# Patient Record
Sex: Female | Born: 1965 | Hispanic: Yes | Marital: Single | Smoking: Never smoker
Health system: Southern US, Community
[De-identification: ages and names within clinical notes are randomized; demographics above are authoritative.]

## PROBLEM LIST (undated history)

## (undated) HISTORY — PX: APPENDECTOMY: SHX54

---

## 2004-03-05 ENCOUNTER — Encounter: Admission: RE | Admit: 2004-03-05 | Discharge: 2004-03-05 | Payer: Self-pay | Admitting: Obstetrics and Gynecology

## 2005-11-01 ENCOUNTER — Inpatient Hospital Stay (HOSPITAL_COMMUNITY): Admission: AD | Admit: 2005-11-01 | Discharge: 2005-11-01 | Payer: Self-pay | Admitting: Obstetrics & Gynecology

## 2005-11-03 ENCOUNTER — Inpatient Hospital Stay (HOSPITAL_COMMUNITY): Admission: AD | Admit: 2005-11-03 | Discharge: 2005-11-03 | Payer: Self-pay | Admitting: Obstetrics and Gynecology

## 2005-12-01 ENCOUNTER — Ambulatory Visit (HOSPITAL_COMMUNITY): Admission: RE | Admit: 2005-12-01 | Discharge: 2005-12-01 | Payer: Self-pay | Admitting: Obstetrics and Gynecology

## 2005-12-01 ENCOUNTER — Ambulatory Visit: Payer: Self-pay | Admitting: Obstetrics & Gynecology

## 2005-12-17 ENCOUNTER — Ambulatory Visit (HOSPITAL_COMMUNITY): Admission: RE | Admit: 2005-12-17 | Discharge: 2005-12-17 | Payer: Self-pay | Admitting: *Deleted

## 2006-01-05 ENCOUNTER — Ambulatory Visit: Payer: Self-pay | Admitting: Obstetrics & Gynecology

## 2006-04-06 ENCOUNTER — Inpatient Hospital Stay (HOSPITAL_COMMUNITY): Admission: AD | Admit: 2006-04-06 | Discharge: 2006-04-09 | Payer: Self-pay | Admitting: Obstetrics & Gynecology

## 2006-04-06 ENCOUNTER — Ambulatory Visit: Payer: Self-pay | Admitting: Family Medicine

## 2007-11-29 ENCOUNTER — Inpatient Hospital Stay (HOSPITAL_COMMUNITY): Admission: AD | Admit: 2007-11-29 | Discharge: 2007-11-30 | Payer: Self-pay | Admitting: Obstetrics & Gynecology

## 2008-01-17 ENCOUNTER — Ambulatory Visit (HOSPITAL_COMMUNITY): Admission: RE | Admit: 2008-01-17 | Discharge: 2008-01-17 | Payer: Self-pay | Admitting: Obstetrics & Gynecology

## 2008-03-01 ENCOUNTER — Emergency Department (HOSPITAL_COMMUNITY): Admission: EM | Admit: 2008-03-01 | Discharge: 2008-03-02 | Payer: Self-pay | Admitting: Emergency Medicine

## 2008-06-11 ENCOUNTER — Inpatient Hospital Stay (HOSPITAL_COMMUNITY): Admission: AD | Admit: 2008-06-11 | Discharge: 2008-06-13 | Payer: Self-pay | Admitting: Gynecology

## 2008-06-11 ENCOUNTER — Ambulatory Visit: Payer: Self-pay | Admitting: Family Medicine

## 2008-12-11 ENCOUNTER — Encounter (INDEPENDENT_AMBULATORY_CARE_PROVIDER_SITE_OTHER): Payer: Self-pay | Admitting: Surgery

## 2008-12-11 ENCOUNTER — Inpatient Hospital Stay (HOSPITAL_COMMUNITY): Admission: EM | Admit: 2008-12-11 | Discharge: 2008-12-12 | Payer: Self-pay | Admitting: Emergency Medicine

## 2010-10-18 ENCOUNTER — Encounter: Payer: Self-pay | Admitting: *Deleted

## 2011-01-07 LAB — URINE MICROSCOPIC-ADD ON

## 2011-01-07 LAB — BASIC METABOLIC PANEL
BUN: 12 mg/dL (ref 6–23)
Calcium: 8.9 mg/dL (ref 8.4–10.5)
Chloride: 106 mEq/L (ref 96–112)
Creatinine, Ser: 0.71 mg/dL (ref 0.4–1.2)
GFR calc Af Amer: >60 mL/min (ref >60)
GFR calc non Af Amer: >60 mL/min (ref >60)
Potassium: 4 mEq/L (ref 3.5–5.1)

## 2011-01-07 LAB — DIFFERENTIAL
Basophils Relative: 0 % (ref 0–1)
Eosinophils Relative: 0 % (ref 0–5)
Lymphocytes Relative: 8 % — ABNORMAL LOW (ref 12–46)
Lymphs Abs: 1.4 10*3/uL (ref 0.7–4.0)
Monocytes Absolute: 1 10*3/uL (ref 0.1–1.0)
Monocytes Relative: 6 % (ref 3–12)
Neutro Abs: 14.5 10*3/uL — ABNORMAL HIGH (ref 1.7–7.7)
Neutrophils Relative %: 85 % — ABNORMAL HIGH (ref 43–77)

## 2011-01-07 LAB — URINALYSIS, ROUTINE W REFLEX MICROSCOPIC: Urobilinogen, UA: 0.2 mg/dL (ref 0.0–1.0)

## 2011-01-07 LAB — CBC: RDW: 13.7 % (ref 11.5–15.5)

## 2011-02-09 NOTE — H&P (Signed)
NAMECAM, HARNDEN      ACCOUNT NO.:  [id number]   MEDICAL RECORD NO.:  [id number]          PATIENT TYPE:  INP   LOCATION:  5151                         FACILITY:  [facility]   PHYSICIAN:  Sandria Bales. Ezzard Standing, M.D.  DATE OF BIRTH:  15-Apr-1966   DATE OF ADMISSION:  12/10/2008  DATE OF DISCHARGE:                              HISTORY & PHYSICAL   Date of Admission - 11 December 2008   HISTORY OF ILLNESS:  This is a 45 year old Hispanic female who has no  identified primary medical doctor who developed right-sided abdominal  pain.  This morning, this pain worsened and localized to the right lower  quadrant.  She was brought in by her husband about 6 p.m. tonight.  CT  scan evaluation revealed probable appendicitis.   The patient had an episode of right-sided pain when she was pregnancy  last year, but this resolved with no clear diagnosis.  She has had no  further symptoms since then.   She denies any history of peptic ulcer disease, liver disease,  pancreatic disease, colon disease, or any prior abdominal surgery.  Both  her babies have been born vaginally.   PAST MEDICAL HISTORY:  She has no allergies.   She is on no medications, though she took some ibuprofen today.   REVIEW OF SYSTEMS:  NEUROLOGIC:  No seizure or loss of consciousness.  PULMONARY:  Does not smoke cigarettes.  No history of pneumonia or  tuberculosis.  CARDIAC:  No history of heart disease, chest pain, or hypertension.  GASTROINTESTINAL:  See history of present illness.  GYN:  She has had two children, one child about 8 years old and then her  second child was born on June 11, 2008, she is about 7 months old,  she is still breastfeeding.  UROLOGIC:  No kidney stones or kidney infections.   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  She is accompanied  by her husband.  She understands English pretty well and speaks it very  well.  Her husband understands and speaks [place] well.   PHYSICAL  EXAMINATION:  VITAL SIGNS:  Her temperature is 100.1, her pulse  is 95, her blood pressure is 95/62, pulse 76, respirations 20.  GENERAL:  She is a well-nourished Hispanic female, alert and cooperative  on physical exam.  HEENT:  Unremarkable.  NECK:  Supple without mass, without thyromegaly.  LUNGS:  Clear to auscultation.  HEART:  Regular rate and rhythm without murmur or rub.  ABDOMEN:  She is tender in the right abdomen, a little bit right upper  quadrant, but well localized in right lower quadrant with some guarding.  She has no rebound.  She has bowel sounds, which are decreased but  present.  EXTREMITIES:  She has good strength on four extremities and  neurologically is grossly intact.   Labs that I have show a white blood count of 17,000, hemoglobin of 14,  hematocrit 41, platelet count 323,000.  Her sodium was 137, potassium  4.0, chloride of 106, CO2 of 25, glucose of 110.  Her urinalysis was  negative.  Her pregnancy test is negative.  Her  CT scan shows changes consistent with probable appendicitis.   IMPRESSION:  1. Probable appendicitis.  Discussed with the patient and her husband      about proceeding to the operating room tonight, discussed about      laparoscopic and open surgery.  Risks of surgery include, but are      not limited to, bleeding, infection, which I think she already has,      a possibility of open surgery and a possibility of the diagnosis is      not appendicitis.   1. Recent pregnancy      Sandria Bales. Ezzard Standing, M.D.  Electronically Signed     DHN/MEDQ  D:  12/11/2008  T:  12/11/2008  Job:  [id number]

## 2011-02-09 NOTE — Op Note (Signed)
Sheena Walton, Sheena Walton      ACCOUNT NO.:  [id number]   MEDICAL RECORD NO.:  [id number]          PATIENT TYPE:  INP   LOCATION:  5151                         FACILITY:  [facility]   PHYSICIAN:  Sandria Bales. Ezzard Standing, M.D.  DATE OF BIRTH:  Apr 01, 1966   DATE OF PROCEDURE:  12/11/2008  DATE OF DISCHARGE:                               OPERATIVE REPORT   Date of Surgery - 11 December 2008   PREOPERATIVE DIAGNOSIS:  Acute appendicitis.   POSTOPERATIVE DIAGNOSIS:  Acute purulent appendicitis.   PROCEDURES:  Laparoscopic appendectomy.   SURGEON:  Sandria Bales. Ezzard Standing, MD   FIRST ASSISTANT:  None.   ANESTHESIA:  General endotracheal.   ESTIMATED BLOOD LOSS:  Minimal.   INDICATIONS FOR PROCEDURE:  Sheena Walton is a 45 year old Hispanic  female who has acute appendicitis.  She has no primary MD.  Her husband  was at her bedside during the interview and discussion.  He understand  [place] well.   I have discussed with her the indications and potential complications of  appendectomy.  Potential complications include, but are not limited to,  bleeding, infection, bowel injury, and the possibility of open surgery.   OPERATIVE NOTE:  The patient placed in a supine position with her left  arm tucked aside.  She had Unasyn as antibiotic.  Foley catheter was in  place.  Abdomen prepped with Betadine solution and sterilely draped, and  the time-out was held identifying the patient and the procedure.   I accessed her abdominal cavity through a Hasson trocar in a  infraumbilical incision and this was sewn in place with 0 Vicryl suture.  I placed a 5-mm trocar in the right upper quadrant, 11-mm trocar in the  left lower quadrant during abdominal exploration.  Right and left lobes  of liver were unremarkable.  The stomach was mildly dilated, but  otherwise unremarkable.  Her bowel that I could see was unremarkable.  She did have some band-like adhesions in her pelvis down to a uterine  fibroid which was  probably about 2 cm in diameter.  I divided these  adhesions with a harmonic scalpel and took photos and put these in the  chart.  I then turned attention to the appendix had purulence on it  consistent with acute purulent appendicitis.  Part of the small bowel,  terminal ileum was draped over this and took this down with a harmonic  scalpel, then took down the mesentery of the appendix with a harmonic  scalpel.  I divided the base of the appendix with a 45-mm Endo-GIA  stapler, a vascular load, and delivered the appendix through the  umbilicus to the  Endocatch bag.   I then reinspected the staple line of the cecum.  I irrigated this with  about 300 mL of saline.  I reinspected the adhesions that I took down to  the pelvis and saw no bleeding from these.  The trocars were then  removed in turn.  The umbilical port was closed with 0 Vicryl suture.  Each port was removed and no bleeding at port site.   The skin was closed using a 5-0 Vicryl suture, painted with tincture  of  Benzoin and Steri-stripped.  The patient was transferred to recovery  room in good condition.  Sponge and needle count were correct at the end  of the case.      Sandria Bales. Ezzard Standing, M.D.  Electronically Signed     DHN/MEDQ  D:  12/11/2008  T:  12/11/2008  Job:  [id number]

## 2011-02-12 NOTE — Group Therapy Note (Signed)
Sheena Walton, Sheena Walton                         ACCOUNT NO.:  [id number]   MEDICAL RECORD NO.:  [id number]                   PATIENT TYPE:  OUT   LOCATION:  [facility]                           FACILITY:  [facility]   PHYSICIAN:  Tinnie Gens, MD                     DATE OF BIRTH:  1966-05-28   DATE OF SERVICE:  03/05/2004                                    CLINIC NOTE   CHIEF COMPLAINT:  Pap smear.   HISTORY OF PRESENT ILLNESS:  The patient is a 45 year old gravida 0 who  comes in today for her first GYN exam.  She has no significant complaints  today.  She reports that she has been trying to get pregnant for the last 6  months but she is still using condoms occasionally.  She has been with her  current partner for 6 months.  She also is requesting HIV testing today.   PAST MEDICAL HISTORY:  Negative.   PAST SURGICAL HISTORY:  Negative.   MEDICATIONS:  None.   ALLERGIES:  None known.   GYNECOLOGICAL HISTORY:  Menarche at age 62, monthly cycles.  LMP was May  22nd.  No history of STD's.  No history of pelvic infections.  She has no  history of a previous Pap smear.   FAMILY HISTORY:  Significant for hypertension.   SOCIAL HISTORY:  No tobacco, alcohol, or drug use.   A 14-point review of systems reviewed and is negative.   PHYSICAL EXAMINATION:  VITAL SIGNS:  Pulse 75, blood pressure 125/78, weight  139.  GENERAL:  She is well-developed, well-nourished Hispanic female in no acute  distress.  ABDOMEN:  Soft, nontender, nondistended.  GENITOURINARY:  Normal external female genitalia.  The vagina is rugated.  The cervix is nulliparous and clean.  The uterus is long and anteverted.  There is no adnexal mass or tenderness.   IMPRESSION:  1. Gynecologic exam.  2. Human immunodeficiency virus screening.   PLAN:  1. Pap smear today, HIV testing.  2. The patient will follow up in 6 months if she does not get pregnant.     Some counseling was done with the patient     regarding  appropriate timing of intercourse and to stop using condoms.     If in 6 months she has not had a pregnancy, we will begin infertility     workup.                                               Tinnie Gens, MD    TP/MEDQ  D:  03/05/2004  T:  03/05/2004  Job:  [id number]

## 2011-06-21 LAB — ABO/RH: ABO/RH(D): A POS

## 2011-06-21 LAB — CBC: RDW: 14.7

## 2011-06-21 LAB — WET PREP, GENITAL
Trich, Wet Prep: NONE SEEN
Yeast Wet Prep HPF POC: NONE SEEN

## 2011-06-21 LAB — GC/CHLAMYDIA PROBE AMP, GENITAL: GC Probe Amp, Genital: NEGATIVE

## 2011-06-24 LAB — COMPREHENSIVE METABOLIC PANEL
AST: 31
Albumin: 3.2 — ABNORMAL LOW
BUN: 7
Chloride: 102
Creatinine, Ser: 0.56
GFR calc non Af Amer: >60
Glucose, Bld: 88
Total Bilirubin: 0.6
Total Protein: 6.6

## 2011-06-24 LAB — CBC
HCT: 38.3
Hemoglobin: 13.2
MCHC: 34.5
Platelets: 325

## 2011-06-24 LAB — URINALYSIS, ROUTINE W REFLEX MICROSCOPIC
Ketones, ur: >80 — AB
Protein, ur: 30 — AB
Specific Gravity, Urine: 1.029
Urobilinogen, UA: 0.2
pH: 6.5

## 2011-06-24 LAB — DIFFERENTIAL
Basophils Relative: 1
Eosinophils Absolute: 0
Monocytes Absolute: 0.4
Monocytes Relative: 3
Neutrophils Relative %: 89 — ABNORMAL HIGH

## 2011-06-24 LAB — URINE MICROSCOPIC-ADD ON

## 2011-06-24 LAB — LIPASE, BLOOD: Lipase: 24

## 2011-06-28 LAB — CBC
Hemoglobin: 12.4
MCHC: 33.3
MCV: 87.8
RDW: 14.7

## 2019-12-10 ENCOUNTER — Encounter (HOSPITAL_COMMUNITY): Payer: Self-pay

## 2019-12-10 ENCOUNTER — Ambulatory Visit (HOSPITAL_COMMUNITY)
Admission: EM | Admit: 2019-12-10 | Discharge: 2019-12-10 | Disposition: A | Discharge status: Home / Self Care | Payer: HRSA Program | Attending: Family Medicine | Admitting: Family Medicine

## 2019-12-10 DIAGNOSIS — Z20822 Contact with and (suspected) exposure to covid-19: Secondary | ICD-10-CM | POA: Insufficient documentation

## 2019-12-10 DIAGNOSIS — R112 Nausea with vomiting, unspecified: Secondary | ICD-10-CM | POA: Diagnosis present

## 2019-12-10 DIAGNOSIS — R630 Anorexia: Secondary | ICD-10-CM | POA: Insufficient documentation

## 2019-12-10 DIAGNOSIS — G43919 Migraine, unspecified, intractable, without status migrainosus: Secondary | ICD-10-CM | POA: Insufficient documentation

## 2019-12-10 DIAGNOSIS — R5383 Other fatigue: Secondary | ICD-10-CM | POA: Diagnosis present

## 2019-12-10 MED ORDER — ONDANSETRON HCL 4 MG PO TABS: 4.0000 mg | ORAL_TABLET | Freq: Four times a day (QID) | ORAL | 0 refills | Status: AC

## 2019-12-10 MED ORDER — ONDANSETRON 4 MG PO TBDP
4.0000 mg | ORAL_TABLET | Freq: Once | ORAL | Status: AC
  Administered 2019-12-10: 4 mg via ORAL

## 2019-12-10 MED ORDER — KETOROLAC TROMETHAMINE 60 MG/2ML IM SOLN
60.0000 mg | Freq: Once | INTRAMUSCULAR | Status: AC
  Administered 2019-12-10: 60 mg via INTRAMUSCULAR

## 2019-12-10 MED ORDER — ONDANSETRON 4 MG PO TBDP
ORAL_TABLET | ORAL | Status: AC
  Filled 2019-12-10: qty 1

## 2019-12-10 MED ORDER — MECLIZINE HCL 25 MG PO TABS: 25.0000 mg | ORAL_TABLET | Freq: Three times a day (TID) | ORAL | 0 refills | Status: AC | PRN

## 2019-12-10 MED ORDER — KETOROLAC TROMETHAMINE 60 MG/2ML IM SOLN
INTRAMUSCULAR | Status: AC
  Filled 2019-12-10: qty 2

## 2019-12-10 NOTE — ED Provider Notes (Signed)
[facility]    CSN: [id number] Arrival date & time: 12/10/19  1109      History   Chief Complaint Chief Complaint  Patient presents with  . Emesis  . Headache    HPI Sheena Walton is a 54 y.o. female.   Patient reports headache behind her eyes, nausea, vomiting, nasal congestion, pain with moving her eyes, chills and dizziness for the last 5 days.  No significant medical history.  Denies sick contacts.  Denies cough shortness of breath, diarrhea, fever, rash, other symptoms.  ROS per HPI  The history is provided by the patient and the spouse.    History reviewed. No pertinent past medical history.  There are no problems to display for this patient.   Past Surgical History:  Procedure Laterality Date  . APPENDECTOMY      OB History   No obstetric history on file.      Home Medications    Prior to Admission medications   Medication Sig Start Date End Date Taking? Authorizing Provider  meclizine (ANTIVERT) 25 MG tablet Take 1 tablet (25 mg total) by mouth 3 (three) times daily as needed for dizziness. 12/10/19   Moshe Cipro, NP  ondansetron (ZOFRAN) 4 MG tablet Take 1 tablet (4 mg total) by mouth every 6 (six) hours. 12/10/19   Moshe Cipro, NP    Family History Family History  Problem Relation Age of Onset  . Healthy Mother   . Healthy Father     Social History Social History   Tobacco Use  . Smoking status: Never Smoker  . Smokeless tobacco: Never Used  Substance Use Topics  . Alcohol use: Not Currently  . Drug use: Never     Allergies   Patient has no known allergies.   Review of Systems Review of Systems   Physical Exam Triage Vital Signs ED Triage Vitals  Enc Vitals Group     BP 12/10/19 1128 130/73     Pulse Rate 12/10/19 1128 71     Resp 12/10/19 1128 17     Temp 12/10/19 1128 98.5 F (36.9 C)     Temp Source 12/10/19 1128 Oral     SpO2 12/10/19 1128 100 %     Weight 12/10/19 1125 165 lb (74.8 kg)      Height 12/10/19 1125 5\' 5"  (1.651 m)     Head Circumference --      Peak Flow --      Pain Score 12/10/19 1125 0     Pain Loc --      Pain Edu? --      Excl. in GC? --    No data found.  Updated Vital Signs BP 130/73 (BP Location: Left Arm)   Pulse 71   Temp 98.5 F (36.9 C) (Oral)   Resp 17   Ht 5\' 5"  (1.651 m)   Wt 165 lb (74.8 kg)   LMP 12/03/2019 (Approximate)   SpO2 100%   BMI 27.46 kg/m   Visual Acuity Right Eye Distance:   Left Eye Distance:   Bilateral Distance:    Right Eye Near:   Left Eye Near:    Bilateral Near:     Physical Exam Vitals and nursing note reviewed.  Constitutional:      General: She is not in acute distress.    Appearance: Normal appearance. She is well-developed. She is ill-appearing.  HENT:     Head: Normocephalic and atraumatic.     Right Ear: Tympanic membrane normal.  Left Ear: Tympanic membrane normal.     Nose: Congestion present.     Mouth/Throat:     Mouth: Mucous membranes are moist.     Pharynx: No posterior oropharyngeal erythema.  Eyes:     General: No scleral icterus.       Right eye: No discharge.        Left eye: No discharge.     Extraocular Movements: Extraocular movements intact.     Conjunctiva/sclera: Conjunctivae normal.     Pupils: Pupils are equal, round, and reactive to light.  Cardiovascular:     Rate and Rhythm: Normal rate and regular rhythm.     Heart sounds: Normal heart sounds. No murmur.  Pulmonary:     Effort: Pulmonary effort is normal. No respiratory distress.     Breath sounds: Normal breath sounds. No stridor. No wheezing, rhonchi or rales.  Chest:     Chest wall: No tenderness.  Abdominal:     General: Bowel sounds are normal. There is no distension.     Palpations: Abdomen is soft. There is no mass.     Tenderness: There is no abdominal tenderness. There is no guarding or rebound.     Hernia: No hernia is present.  Musculoskeletal:        General: Normal range of motion.      Cervical back: Normal range of motion and neck supple.  Skin:    General: Skin is warm and dry.     Capillary Refill: Capillary refill takes less than 2 seconds.  Neurological:     General: No focal deficit present.     Mental Status: She is alert and oriented to person, place, and time. Mental status is at baseline.     Cranial Nerves: No cranial nerve deficit.     Sensory: No sensory deficit.     Motor: No weakness.     Coordination: Coordination normal.     Gait: Gait normal.     Deep Tendon Reflexes: Reflexes normal.  Psychiatric:        Mood and Affect: Mood normal.        Behavior: Behavior normal.      UC Treatments / Results  Labs (all labs ordered are listed, but only abnormal results are displayed) Labs Reviewed  SARS CORONAVIRUS 2 (TAT 6-24 HRS)    EKG   Radiology No results found.  Procedures Procedures (including critical care time)  Medications Ordered in UC Medications  ondansetron (ZOFRAN-ODT) disintegrating tablet 4 mg (4 mg Oral Given 12/10/19 1149)  ketorolac (TORADOL) injection 60 mg (60 mg Intramuscular Given 12/10/19 1157)    Initial Impression / Assessment and Plan / UC Course  I have reviewed the triage vital signs and the nursing notes.  Pertinent labs & imaging results that were available during my care of the patient were reviewed by me and considered in my medical decision making (see chart for details).     Headache, eye pain with eye movement, dizziness, decreased appetite, nausea, vomiting x5 days.  Zofran 4 mg sublingual given in office today.  Zofran 4 mg sublingual sent to patient's pharmacy.  Meclizine 25 mg sent to patient's pharmacy for dizziness.  Received 60 mg Toradol IM in office today to try to help relieve headache.  If there is no relief of the headache or eye pain, patient is to report to the ER.  Patient and husband agreeable to treatment plan. Final Clinical Impressions(s) / UC Diagnoses   Final diagnoses:  Nausea  and  vomiting, intractability of vomiting not specified, unspecified vomiting type  Intractable migraine without status migrainosus, unspecified migraine type  Other fatigue  Decreased appetite     Discharge Instructions     You may have ocular migraine, and this is why your eyes are hurting.  I have given you Toradol injection in the office today.  Have also given you Zofran for nausea in the office today.  I have sent in Zofran for nausea to your pharmacy.  I have also sent in meclizine for your dizziness.  Your COVID test is pending.  You should self quarantine until your test result is back and is negative.    Take Tylenol as needed for fever or discomfort.  Rest and keep yourself hydrated.    Go to the emergency department if you develop high fever, shortness of breath, severe diarrhea, or other concerning symptoms.        ED Prescriptions    Medication Sig Dispense Auth. Provider   ondansetron (ZOFRAN) 4 MG tablet Take 1 tablet (4 mg total) by mouth every 6 (six) hours. 12 tablet Moshe Cipro, NP   meclizine (ANTIVERT) 25 MG tablet Take 1 tablet (25 mg total) by mouth 3 (three) times daily as needed for dizziness. 30 tablet Moshe Cipro, NP     I have reviewed the PDMP during this encounter.   Moshe Cipro, NP 12/10/19 1230

## 2019-12-10 NOTE — ED Triage Notes (Signed)
Pt presents with c/o emesis, headache, nasal congestion, eye pain, chills and dizziness for several days. Pt denies cough, shob, diarrhea or other symptoms. No one else in the home is sick. Pt denies any known sick contacts.

## 2019-12-10 NOTE — ED Notes (Signed)
Spoke to patient .  Patient has headache, vomiting for 3 days.  Spoke to Dahlia Byes, NP about patient, to be seen at [facility]

## 2019-12-10 NOTE — Discharge Instructions (Addendum)
You may have ocular migraine, and this is why your eyes are hurting.  I have given you Toradol injection in the office today.  Have also given you Zofran for nausea in the office today.  I have sent in Zofran for nausea to your pharmacy.  I have also sent in meclizine for your dizziness.  Your COVID test is pending.  You should self quarantine until your test result is back and is negative.    Take Tylenol as needed for fever or discomfort.  Rest and keep yourself hydrated.    Go to the emergency department if you develop high fever, shortness of breath, severe diarrhea, or other concerning symptoms.

## 2019-12-11 LAB — SARS CORONAVIRUS 2 (TAT 6-24 HRS): SARS Coronavirus 2: NEGATIVE

## 2020-01-09 ENCOUNTER — Other Ambulatory Visit: Payer: Self-pay | Admitting: Nurse Practitioner

## 2020-01-09 ENCOUNTER — Other Ambulatory Visit: Payer: Self-pay

## 2020-01-09 DIAGNOSIS — Z1231 Encounter for screening mammogram for malignant neoplasm of breast: Secondary | ICD-10-CM

## 2020-01-24 ENCOUNTER — Ambulatory Visit
Admission: RE | Admit: 2020-01-24 | Discharge: 2020-01-24 | Disposition: A | Discharge status: Home / Self Care | Payer: No Typology Code available for payment source | Source: Ambulatory Visit | Attending: Nurse Practitioner | Admitting: Nurse Practitioner

## 2020-01-24 ENCOUNTER — Other Ambulatory Visit: Payer: Self-pay

## 2020-01-24 DIAGNOSIS — Z1231 Encounter for screening mammogram for malignant neoplasm of breast: Secondary | ICD-10-CM

## 2020-12-10 ENCOUNTER — Other Ambulatory Visit: Payer: Self-pay | Admitting: Obstetrics and Gynecology

## 2020-12-10 DIAGNOSIS — Z1231 Encounter for screening mammogram for malignant neoplasm of breast: Secondary | ICD-10-CM

## 2021-01-22 ENCOUNTER — Ambulatory Visit: Payer: No Typology Code available for payment source | Admitting: Obstetrics and Gynecology

## 2021-01-27 ENCOUNTER — Encounter (INDEPENDENT_AMBULATORY_CARE_PROVIDER_SITE_OTHER): Payer: Self-pay

## 2021-01-27 ENCOUNTER — Other Ambulatory Visit: Payer: Self-pay

## 2021-01-27 ENCOUNTER — Ambulatory Visit
Admission: RE | Admit: 2021-01-27 | Discharge: 2021-01-27 | Disposition: A | Discharge status: Home / Self Care | Payer: No Typology Code available for payment source | Source: Ambulatory Visit | Attending: Obstetrics and Gynecology | Admitting: Obstetrics and Gynecology

## 2021-01-27 ENCOUNTER — Ambulatory Visit: Payer: No Typology Code available for payment source | Admitting: *Deleted

## 2021-01-27 VITALS — BP 132/84 | Wt 171.0 lb

## 2021-01-27 DIAGNOSIS — Z1239 Encounter for other screening for malignant neoplasm of breast: Secondary | ICD-10-CM

## 2021-01-27 DIAGNOSIS — R87610 Atypical squamous cells of undetermined significance on cytologic smear of cervix (ASC-US): Secondary | ICD-10-CM

## 2021-01-27 DIAGNOSIS — Z1231 Encounter for screening mammogram for malignant neoplasm of breast: Secondary | ICD-10-CM

## 2021-01-27 DIAGNOSIS — R87810 Cervical high risk human papillomavirus (HPV) DNA test positive: Secondary | ICD-10-CM

## 2021-01-27 NOTE — Progress Notes (Signed)
Sheena Walton is a 55 y.o. female who presents to [facility] clinic today with no complaints. Patient referred to [facility] by the [facility] due to having an abnormal Pap smear 12/09/2020 that a colposcopy is recommended for follow-up.    Pap Smear: Pap smear not completed today. Last Pap smear was 12/09/2020 at the [facility] clinic and was abnormal - ASCUS with positive HPV. Per patient has no history of an abnormal Pap smear prior to her most recent Pap smear. Last Pap smear result is available in Epic.   Physical exam: Breasts Breasts symmetrical. No skin abnormalities bilateral breasts. No nipple retraction bilateral breasts. No nipple discharge bilateral breasts. No lymphadenopathy. No lumps palpated bilateral breasts. No complaints of pain or tenderness on exam.      Pelvic/Bimanual Pap is not indicated today per BCCCP guidelines.    Smoking History: Patient has never smoked.   Patient Navigation: Patient education provided. Access to services provided for patient through [facility] [place] program. Spanish interpreter Sheena Walton from [facility] provided.   Colorectal Cancer Screening: Per patient has never had colonoscopy completed. No complaints today.    Breast and Cervical Cancer Risk Assessment: Patient has family history of her sister having breast cancer. Patient has no known genetic mutations or history of radiation treatment to the chest before age 18. Patient does not have history of cervical dysplasia, immunocompromised, or DES exposure in-utero.  Risk Assessment    Risk Scores      01/27/2021   Last edited by: Sheena Walton, CMA   5-year risk: 1.5 %   Lifetime risk: 10.6 %          A: BCCCP exam without pap smear No complaints.  P: Referred patient to the [facility] for a screening mammogram on the mobile unit. Appointment scheduled Tuesday, Jan 27, 2021 at 1100.  Referred patient to the [facility]  for [facility] for her colposcopy to follow-up for her abnormal Pap smear. Appointment scheduled Monday, Feb 02, 2021 at 0815.  Sheena Heidelberg, RN 01/27/2021 10:07 AM

## 2021-01-27 NOTE — Patient Instructions (Signed)
Explained breast self awareness with Sheena Walton. Patient did not need a Pap smear today due to last Pap smear was 12/09/2020. Explained the colposcopy the recommended follow-up for her abnormal Pap smear. Referred patient to the [facility] for [facility] for her colposcopy to follow-up for her abnormal Pap smear. Appointment scheduled Monday, Feb 02, 2021 at 0815. Referred patient to the [facility] of [facility] for a screening mammogram on the mobile unit. Appointment scheduled Tuesday, Jan 27, 2021 at 1100. Patient escorted to the mobile unit following BCCCP appointment for her screening mammogram. Let patient know the Breast Center will follow up with her within the next couple weeks with results of her mammogram by letter or phone. Sheena Walton verbalized understanding.  Sheena Walton, Kathaleen Maser, RN 10:07 AM

## 2021-01-29 ENCOUNTER — Other Ambulatory Visit: Payer: Self-pay | Admitting: Obstetrics and Gynecology

## 2021-01-29 DIAGNOSIS — R928 Other abnormal and inconclusive findings on diagnostic imaging of breast: Secondary | ICD-10-CM

## 2021-02-02 ENCOUNTER — Encounter: Payer: Self-pay | Admitting: Obstetrics and Gynecology

## 2021-02-02 ENCOUNTER — Other Ambulatory Visit (HOSPITAL_COMMUNITY)
Admission: RE | Admit: 2021-02-02 | Discharge: 2021-02-02 | Disposition: A | Discharge status: Home / Self Care | Payer: No Typology Code available for payment source | Source: Ambulatory Visit | Attending: Obstetrics and Gynecology | Admitting: Obstetrics and Gynecology

## 2021-02-02 ENCOUNTER — Ambulatory Visit (INDEPENDENT_AMBULATORY_CARE_PROVIDER_SITE_OTHER): Payer: Self-pay | Admitting: Obstetrics and Gynecology

## 2021-02-02 ENCOUNTER — Other Ambulatory Visit: Payer: Self-pay

## 2021-02-02 DIAGNOSIS — R87810 Cervical high risk human papillomavirus (HPV) DNA test positive: Secondary | ICD-10-CM

## 2021-02-02 DIAGNOSIS — Z3202 Encounter for pregnancy test, result negative: Secondary | ICD-10-CM

## 2021-02-02 DIAGNOSIS — R87610 Atypical squamous cells of undetermined significance on cytologic smear of cervix (ASC-US): Secondary | ICD-10-CM | POA: Insufficient documentation

## 2021-02-02 LAB — POCT PREGNANCY, URINE: Preg Test, Ur: NEGATIVE

## 2021-02-02 NOTE — Progress Notes (Signed)
Patient given informed consent, signed copy in the chart, time out was performed.  Placed in lithotomy position.  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  UPT negative. Cervix viewed with speculum and colposcope after application of acetic acid.   Colposcopy adequate?  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  no Biopsies? Yes at 12 and 2 oclock ECC? no   Paraguard IUD strings noted in cervical os.  COMMENTS:  Patient was given post procedure instructions.   Warden Fillers, MD

## 2021-02-02 NOTE — Patient Instructions (Signed)
Displasia cervical Cervical Dysplasia  La displasia cervical es una afeccin que ocurre cuando las clulas del cuello uterino de una mujer presentan cambios anormales. El cuello uterino es la abertura del tero. Se encuentra entre la vagina y Careers information officer. La displasia cervical puede ser un signo temprano de cncer de cuello uterino. Si no se trata, esta afeccin puede volverse ms grave y progresar a cncer de cuello uterino. La deteccin temprana, el tratamiento y la atencin de seguimiento son Engineer, production. Cules son las causas? La displasia cervical generalmente es causada por una infeccin del virus del papiloma humano (VPH). El VPH se propaga de Neomia Dear persona a otra a travs del contacto sexual. Esto abarca la actividad sexual vaginal, oral y anal. La infeccin causada por el VPH es la infeccin de transmisin sexual (ITS) ms frecuente. Tiene ms probabilidades de estar expuesto al VPH a travs del contacto sexual si:  Ha tenido ms de [place] pareja sexual o tiene una pareja sexual que tiene mltiples parejas sexuales.  No [place] condn [employer], especialmente con parejas sexuales nuevas. Qu incrementa el riesgo? Los siguientes factores pueden hacer que sea ms propensa a Aeronautical engineer afeccin:  Tener antecedentes familiares de cncer de cuello uterino o antecedentes personales de cncer de vagina o vulva.  Haber tenido una ITS, como herpes, clamidia o gonorrea.  Comenzar a llevar una vida sexual activa antes de los [address].  Tener debilitado el sistema que combate las enfermedades (sistemainmunitario).  Fumar.  Ser hija de una mujer que tom dietilestilbestrol (DES), un estrgeno sinttico, Academic librarian. Cules son los signos o sntomas? Esta afeccin generalmente no presenta sntomas. Si tiene sntomas, pueden incluir los siguientes:  Secrecin vaginal anormal.  Sangrado entre los perodos menstruales o despus de Warehouse manager relaciones  sexuales.  Sangrado durante la menopausia.  Dolor al [employer]. Cmo se diagnostica? Esta afeccin se puede diagnosticar con una prueba de Papanicolaou. Durante esta prueba, se extraen clulas del cuello uterino y se observan con un microscopio. Si la prueba de Papanicolaou es anormal o si el aspecto del cuello del tero es anormal, tambin puede realizarse una prueba en la que se extirpa tejido del cuello uterino y se analiza bajo el microscopio(biopsia). Cmo se trata? El tratamiento vara segn la gravedad de la enfermedad. El tratamiento puede incluir:  Crioterapia. Durante esa terapia, las clulas anormales se congelan con un instrumento con punta de acero.  Procedimiento de escisin electroquirrgica con asa (loop electrosurgical excision procedure, LEEP). El LEEP extirpa tejido anormal del cuello uterino.  Ciruga para extirpar tejido anormal. Generalmente, esto se hace en los casos ms graves. Entre las opciones se incluyen las siguientes: ? Biopsia en cono. Este tratamiento elimina el canal cervical y parte del centro del cuello del tero. ? Histerectoma. En esta ciruga se extirpan el tero y el cuello uterino. Siga estas instrucciones en su casa:  Use los medicamentos de venta libre y los recetados solamente como se lo haya indicado el mdico.  No use tampones, duchas vaginales, ni tenga relaciones sexuales hasta que el mdico la autorice.  Cumpla con todas las visitas de seguimiento. Esto es importante. Las mujeres que han recibido tratamiento para la displasia cervical deberan someterse a pruebas de Papanicolaou y exmenes plvicos de forma regular. Cmo se previene?  Practique el sexo seguro para ayudar a prevenir las ITS.  Hgase pruebas de Papanicolaou de forma regular. Hable con el mdico sobre la frecuencia con la que debe hacerse estas pruebas. Las [employer]  de Papanicolaou ayudan a Engineer, manufacturing los [employer] clulas que pueden Therapist, music.  Pregntele al  [employer] las posibles vacunas para protegerse contra el VPH. Comunquese con un mdico si:  Presenta verrugas genitales. El riesgo de cncer de cuello uterino es ms alto con ciertos tipos de VPH.  Observa cogulos de sangre o una hemorragia ms abundante que un periodo menstrual normal o desarrolla una hemorragia de color rojo brillante, que puede incluir cogulos de [place].  Tiene una secrecin vaginal anormal.  Lance Muss. Solicite ayuda de inmediato si:  Tiene dolor o clicos en el abdomen que empeoran y no se alivian con los medicamentos.  Se siente mareada e inusualmente dbil o se desmaya. Resumen  La displasia cervical es una afeccin que ocurre cuando una mujer presenta cambios anormales en las clulas del cuello uterino.  Si no se trata, esta afeccin puede volverse ms grave y progresar a cncer de cuello uterino.  La deteccin temprana, el tratamiento y la atencin de seguimiento son muy importantes para Research officer, trade union.  Hgase exmenes plvicos y pruebas de Papanicolaou de forma regular. Hable con el mdico sobre la frecuencia con la que debe hacerse estas pruebas. Las pruebas de Papanicolaou ayudan a Sports coach en las clulas que pueden Therapist, music. Esta informacin no tiene Theme park manager el consejo del mdico. Asegrese de hacerle al mdico cualquier pregunta que tenga. Document Revised: 04/25/2020 Document Reviewed: 04/25/2020 Elsevier Patient Education  2021 Elsevier Inc. Informacin sobre el VPH y Management consultant HPV and Cancer Information El VPH (virus del papiloma humano)es un virus muy comn que se transmite de Neomia Dear persona a otra a travs del contacto piel con piel o del contacto sexual. Hay varios tipos de VPH. A menudo no causa sntomas. Sin embargo, dependiendo del tipo, a Event organiser (VPH genitalo mucoso) o en las manos o los pies (VPH cutneo o no mucoso). Las personas pueden estar infectadas con el  VPH por un Harle Battiest y transmitirlo a Economist sin saberlo. Algunas infecciones por el VPH desaparecen por s solas en un perodo de 2 aos, pero otras infecciones por el VPH se consideran de alto riesgo y pueden causar cambios en las clulas que pueden Geophysical data processor. Usted puede tomar medidas para evitar la infeccin por el VPH y disminuir el riesgo de Research officer, political party. Cmo puede afectarme el VPH? El VPH puede provocar verrugas en los genitales o en las manos o los pies. Tambin puede causar lesiones similares a verrugas en la garganta.  Ciertos tipos de VPH genital tambin Tourist information centre manager, [employer] se pueden incluir:  Cncer de cuello uterino.  Cncer vaginal.  Cncer vulvar.  Cncer anal.  Cncer de garganta.  Cncer en la lengua o la boca.  Cncer de pene. Cmo se transmite el VPH? El VPH se propaga fcilmente a travs del contacto directo Rolly Salter persona y [place]. El VPH genital se transmite a travs del contacto sexual. Puede contraer VPH por sexo vaginal, oral, anal o simplemente por el contacto con los genitales de Engineer, maintenance (IT). Incluso las [employer] tienen una sola pareja sexual pueden tener VPH porque esa pareja puede tenerlo. El VPH con frecuencia no causa sntomas, por lo que la mayora de las personas infectadas no saben que lo tienen. Qu medidas puedo tomar para prevenir el VPH? [employer] siguientes medidas para prevenir la infeccin por el VPH:  Boyd Kerbs con su mdico acerca de cmo recibir la vacuna  contra el VPH. Esta vacuna la protege contra los tipos de VPH que podran Multimedia programmerprovocar cncer.  Limite la cantidad de personas con las que New York Life Insurancemantiene relaciones sexuales. Adems, evite tener sexo con personas que han tenido demasiadas parejas sexuales.  Use preservativos durante las relaciones sexuales.  Hable con su pareja sexual acerca de su salud.   Qu medidas puedo tomar para reducir mi riesgo de Warehouse managertener cncer? Tener un estilo de vida  saludable y tomar algunas medidas preventivas pueden ayudarle a disminuir su riesgo de Geophysical data processordesarrollar cncer, tanto si tiene o no el VPH genital. Algunas de las medidas que puede implementar, Red Jacketincluyen, entre otras: Estilo de vida  Hospital doctorracticar el sexo seguro para prevenir la infeccin por el VPH.  No consuma ningn producto que contenga nicotina o tabaco, como cigarrillos, cigarrillos electrnicos y tabaco de Theatre managermascar. Si necesita ayuda para dejar de fumar, consulte al mdico.  Consuma alimentos que contengan antioxidantes, como frutas, verduras y Medical laboratory scientific officercereales. Intente consumir, como mnimo, 5porciones de frutas y Sanmina-SCIverduras todos los das.  Haga ejercicio con regularidad.  Baje de peso si es necesario.  Mantenga una buena higiene bucal. Esto incluye el uso del hilo dental y el cepillado diario. Otras medidas de prevencin  Aplquese la vacuna contra el VPH como se lo haya indicado el mdico.  Hgase los estudios de deteccin de ITS aunque no tenga sntomas del VPH. Es posible que tenga VPH y no sepa que lo tiene.  Si es mujer, debe hacerse pruebas de Papanicolaou y VPH con regularidad. Hable con el mdico sobre la frecuencia con la que debe hacerse estas pruebas. Las pruebas de Papanicolaou ayudan a Sports coachdetectar los cambios en las clulas que pueden Geophysical data processordesarrollar cncer. Las pruebas del VPH ayudarn a Higher education careers adviseridentificar la presencia del VPH en las clulas del cuello uterino. Dnde buscar ms informacin Obtenga ms informacin sobre el VPH y Management consultantel cncer en:  Marine scientistCenters for Disease Control and Prevention (Centros para Air traffic controllerel Control y Psychiatristla Prevencin de Event organisernfermedades): RunningConvention.dewww.cdc.gov/hpv  Baker Hughes Incorporatedational Cancer Institute (Instituto Nacional del Cncer): www.cancer.Dentistgov  American Cancer Society (Sociedad Estadounidense contra el Cncer): www.cancer.org Comunquese con un mdico si:  Tiene verrugas genitales.  Es sexualmente Musicianactivo y cree que puede Eldredtener VPH.  No se protegi durante las relaciones sexuales y quisiera realizarse  pruebas de deteccin de ITS. Resumen  El virus del papiloma humano (VPH) es un virus muy comn que se transmite fcilmente de Neomia Dearuna persona a otra y Public relations account executiveessumamente contagioso.  Ciertos tipos de VPH genital se consideran de algo riesgo y pueden causar cambios en las clulas que pueden derivar en cncer.  Debe tomar medidas para evitar la infeccin por el VPH, como limitar el nmero de Chief Strategy Officerparejas sexuales, usar preservativos cuando tiene sexo y aplicarse la vacuna contra el VPH.  Hacer cambios en su estilo de vida ayuda a reducir Publishing copyel riesgo de desarrollar cncer. Estos cambios incluyen tener una dieta saludable, hacer ejercicios regularmente y no consumir productos que contengan nicotina ni tabaco.  Es posible que tenga VPH y no sepa que lo tiene. Hgase los estudios de deteccin de ITS aunque no tenga sntomas del VPH. Si es mujer, debe hacerse pruebas de Papanicolaou y de VPH con regularidad como se lo haya indicado el mdico. Esta informacin no tiene Theme park managercomo fin reemplazar el consejo del mdico. Asegrese de hacerle al mdico cualquier pregunta que tenga. Document Revised: 07/04/2020 Document Reviewed: 07/04/2020 Elsevier Patient Education  2021 Elsevier Inc. https://www.acog.org/Patients/FAQs/Colposcopy">  Colposcopa, cuidados posteriores Colposcopy, Care After Levi StraussEsta hoja le brinda informacin sobre cmo cuidarse  despus del procedimiento. Su mdico tambin podr darle instrucciones ms especficas. Comunquese con el mdico si tiene problemas o preguntas. Qu puedo esperar despus del procedimiento? Si le realizaron una colposcopa sin biopsia, puede esperar sentirse bien inmediatamente despus del procedimiento. Sin embargo, es posible que tenga algo de [place] de sangre durante [place]. Puede retomar sus actividades habituales. Si se le realiz una colposcopa con biopsia, despus del procedimiento es frecuente que presente lo siguiente:  Sensibilidad y dolor leve. Esto puede durar [employer].  Desvanecimiento.  Sangrado leve de la vagina o secrecin de color oscuro y Librarian, academic. Esto puede durar [employer]. La secrecin puede deberse a un lquido (solucin) que se [place] durante el procedimiento. Durante este tiempo deber usar un apsito sanitario.  Manchas de [employer] al menos 48horas despus del procedimiento. Siga estas instrucciones en su casa: Medicamentos  Use los medicamentos de venta libre y los recetados solamente como se lo haya indicado el mdico.  Hable con el mdico acerca de qu tipo de analgsico de venta libre y recetado puede volver a Careers adviser. Es especialmente importante que hable con el mdico si toma anticoagulantes. Actividad  Limite la actividad fsica Education officer, museum despus del procedimiento como se lo haya indicado el mdico.  Evite usar productos de ducha vaginal, usar tampones o Child psychotherapist sexuales durante al menos 3 das despus del procedimiento o durante el tiempo que le hayan indicado.  Retome sus actividades normales segn lo indicado el mdico. Pregntele al mdico qu actividades son seguras para usted. Instrucciones generales  Beber suficiente lquido como para mantener la orina de color amarillo plido.  Pregunte al mdico si puede tomar baos de inmersin, nadar o usar el jacuzzi. Puede ducharse.  Si [place] un mtodo anticonceptivo (anticoncepcin), contine utilizndolo.  Concurra a todas las visitas de [address] se lo haya indicado el mdico. Esto es importante.   Comunquese con un mdico si:  Tiene una erupcin cutnea. Solicite ayuda de inmediato si:  Sangra mucho por la vagina o le salen cogulos de [place]. Esto incluye usar ms de un apsito sanitario cada hora durante 2 horas seguidas.  Tiene fiebre o escalofros.  Tiene secrecin vaginal que es anormal, tiene color amarillo o tiene mal olor. Puede ser un signo de infeccin.  Tiene dolor intenso o clicos en la parte baja del abdomen que no se  van con medicamentos.  Se desmaya. Resumen  Si se le realiz una colposcopa sin biopsia, puede esperar sentirse bien de inmediato, pero es posible que presente manchas de sangre por [address]. Puede retomar sus actividades habituales.  Si se le realiz una colposcopa con biopsia, es comn tener un dolor leve durante [address] y [place] de sangre durante 48 horas despus del procedimiento.  Evite usar productos de ducha vaginal, usar tampones y Child psychotherapist sexuales durante al menos 3 das despus del procedimiento o durante el tiempo que le haya indicado el mdico.  Busque ayuda de inmediato si tiene sangrado profuso, dolor intenso o signos de infeccin. Esta informacin no tiene Theme park manager el consejo del mdico. Asegrese de hacerle al mdico cualquier pregunta que tenga. Document Revised: 10/22/2019 Document Reviewed: 10/22/2019 Elsevier Patient Education  2021 [employer].

## 2021-02-04 LAB — SURGICAL PATHOLOGY

## 2021-02-09 ENCOUNTER — Telehealth: Payer: Self-pay | Admitting: *Deleted

## 2021-02-09 NOTE — Telephone Encounter (Addendum)
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 I informed her of Colpo results and recommendation for next Pap test in one year. Pt voiced understanding and had no questions.

## 2021-02-17 ENCOUNTER — Other Ambulatory Visit: Payer: Self-pay

## 2021-02-17 ENCOUNTER — Ambulatory Visit
Admission: RE | Admit: 2021-02-17 | Discharge: 2021-02-17 | Disposition: A | Discharge status: Home / Self Care | Payer: No Typology Code available for payment source | Source: Ambulatory Visit | Attending: Obstetrics and Gynecology | Admitting: Obstetrics and Gynecology

## 2021-02-17 ENCOUNTER — Other Ambulatory Visit: Payer: Self-pay | Admitting: Obstetrics and Gynecology

## 2021-02-17 DIAGNOSIS — R928 Other abnormal and inconclusive findings on diagnostic imaging of breast: Secondary | ICD-10-CM

## 2021-08-24 ENCOUNTER — Ambulatory Visit
Admission: RE | Admit: 2021-08-24 | Discharge: 2021-08-24 | Disposition: A | Discharge status: Home / Self Care | Payer: No Typology Code available for payment source | Source: Ambulatory Visit | Attending: Obstetrics and Gynecology | Admitting: Obstetrics and Gynecology

## 2021-08-24 ENCOUNTER — Other Ambulatory Visit: Payer: Self-pay

## 2021-08-24 DIAGNOSIS — R928 Other abnormal and inconclusive findings on diagnostic imaging of breast: Secondary | ICD-10-CM

## 2022-05-03 ENCOUNTER — Other Ambulatory Visit: Payer: Self-pay | Admitting: Obstetrics and Gynecology

## 2022-05-03 DIAGNOSIS — Z1231 Encounter for screening mammogram for malignant neoplasm of breast: Secondary | ICD-10-CM

## 2022-05-04 ENCOUNTER — Other Ambulatory Visit: Payer: Self-pay | Admitting: Home Modifications

## 2022-05-18 ENCOUNTER — Ambulatory Visit
Admission: RE | Admit: 2022-05-18 | Discharge: 2022-05-18 | Disposition: A | Discharge status: Home / Self Care | Payer: Managed Care, Other (non HMO) | Source: Ambulatory Visit | Attending: Obstetrics and Gynecology | Admitting: Obstetrics and Gynecology

## 2022-05-18 DIAGNOSIS — Z1231 Encounter for screening mammogram for malignant neoplasm of breast: Secondary | ICD-10-CM

## 2022-10-08 ENCOUNTER — Encounter: Payer: Self-pay | Admitting: *Deleted

## 2022-10-26 ENCOUNTER — Ambulatory Visit: Payer: Managed Care, Other (non HMO) | Admitting: Family Medicine

## 2022-10-26 NOTE — Progress Notes (Signed)
Reviewed ASCCP algorithm, per that review she does not need colpo currently. Recommended she return in August for repeat pap. She is in agreement with plan. Also reminded that she is due for mammogram at that time.

## 2022-11-04 ENCOUNTER — Telehealth: Payer: Self-pay | Admitting: *Deleted

## 2022-11-04 NOTE — Telephone Encounter (Signed)
Received a telephone call from Tamera Punt that patient had abnormal pap and was scheduled for colposcopy but at the visit , the doctor said it was not needed.  She reviewed with Dr. Ilda Basset and he said due to her age, hx she did need colposcopy. 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 I informed her I would have front office to reschedule it and put note in per Dr. Ilda Basset. Message sent to front office. Staci Acosta

## 2022-11-30 ENCOUNTER — Ambulatory Visit (INDEPENDENT_AMBULATORY_CARE_PROVIDER_SITE_OTHER): Payer: Managed Care, Other (non HMO) | Admitting: Obstetrics and Gynecology

## 2022-11-30 ENCOUNTER — Other Ambulatory Visit (HOSPITAL_COMMUNITY)
Admission: RE | Admit: 2022-11-30 | Discharge: 2022-11-30 | Disposition: A | Discharge status: Home / Self Care | Payer: Managed Care, Other (non HMO) | Source: Ambulatory Visit | Attending: Obstetrics and Gynecology | Admitting: Obstetrics and Gynecology

## 2022-11-30 ENCOUNTER — Encounter: Payer: Self-pay | Admitting: Obstetrics and Gynecology

## 2022-11-30 VITALS — BP 131/78 | HR 82 | Ht 63.0 in | Wt 171.6 lb

## 2022-11-30 DIAGNOSIS — Z01419 Encounter for gynecological examination (general) (routine) without abnormal findings: Secondary | ICD-10-CM

## 2022-11-30 DIAGNOSIS — R87810 Cervical high risk human papillomavirus (HPV) DNA test positive: Secondary | ICD-10-CM | POA: Diagnosis not present

## 2022-11-30 DIAGNOSIS — Z113 Encounter for screening for infections with a predominantly sexual mode of transmission: Secondary | ICD-10-CM | POA: Insufficient documentation

## 2022-11-30 DIAGNOSIS — Z975 Presence of (intrauterine) contraceptive device: Secondary | ICD-10-CM | POA: Diagnosis not present

## 2022-11-30 DIAGNOSIS — R87612 Low grade squamous intraepithelial lesion on cytologic smear of cervix (LGSIL): Secondary | ICD-10-CM | POA: Insufficient documentation

## 2022-11-30 LAB — POCT URINALYSIS DIP (DEVICE)
Bilirubin Urine: NEGATIVE
Glucose, UA: NEGATIVE mg/dL
Hgb urine dipstick: NEGATIVE
Ketones, ur: NEGATIVE mg/dL
Leukocytes,Ua: NEGATIVE
Nitrite: NEGATIVE
Protein, ur: 30 mg/dL — AB
Specific Gravity, Urine: >=1.03 (ref 1.005–1.030)
Urobilinogen, UA: 0.2 mg/dL (ref 0.0–1.0)
pH: 6 (ref 5.0–8.0)

## 2022-11-30 NOTE — Progress Notes (Signed)
GYNECOLOGY ANNUAL PREVENTATIVE CARE ENCOUNTER NOTE  History:     Sheena Walton is a 57 y.o. G2P0 female here for a routine annual gynecologic exam.  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   Denies abnormal vaginal bleeding, discharge, pelvic pain, problems with intercourse or other gynecologic concerns.    Gynecologic History Patient's last menstrual period was 11/27/2022 (exact date). Contraception: IUD Last Pap: 11/2020. Results were: abnormal with negative HPV Last mammogram: 8/23. Results were: normal  Obstetric History OB History  Gravida Para Term Preterm AB Living  2            SAB IAB Ectopic Multiple Live Births          2    # Outcome Date GA Lbr Len/2nd Weight Sex Delivery Anes PTL Lv  2 Gravida           1 Gravida             No past medical history on file.  Past Surgical History:  Procedure Laterality Date   APPENDECTOMY      Current Outpatient Medications on File Prior to Visit  Medication Sig Dispense Refill   meclizine (ANTIVERT) 25 MG tablet Take 1 tablet (25 mg total) by mouth 3 (three) times daily as needed for dizziness. (Patient not taking: Reported on 01/27/2021) 30 tablet 0   ondansetron (ZOFRAN) 4 MG tablet Take 1 tablet (4 mg total) by mouth every 6 (six) hours. (Patient not taking: Reported on 01/27/2021) 12 tablet 0   No current facility-administered medications on file prior to visit.    No Known Allergies  Social History:  reports that she has never smoked. She has never used smokeless tobacco. She reports that she does not currently use alcohol. She reports that she does not use drugs.  Family History  Problem Relation Age of Onset   Healthy Mother    Healthy Father    Breast cancer Sister     The following portions of the patient's history were reviewed and updated as appropriate: allergies, current medications, past family history, past medical history, past social history, past surgical  history and problem list.  Review of Systems Pertinent items noted in HPI and remainder of comprehensive ROS otherwise negative.  Physical Exam:  BP 131/78   Pulse 82   Ht '5\' 3"'$  (1.6 m)   Wt 171 lb 9.6 oz (77.8 kg)   LMP 11/27/2022 (Exact Date)   BMI 30.40 kg/m  CONSTITUTIONAL: Well-developed, well-nourished female in no acute distress.  HENT:  Normocephalic, atraumatic, External right and left ear normal. Oropharynx is clear and moist EYES: Conjunctivae and EOM are normal. Pupils are equal, round, and reactive to light. No scleral icterus.  NECK: Normal range of motion, supple, no masses.  Normal thyroid.  SKIN: Skin is warm and dry. No rash noted. Not diaphoretic. No erythema. No pallor. MUSCULOSKELETAL: Normal range of motion. No tenderness.  No cyanosis, clubbing, or edema.  2+ distal pulses. NEUROLOGIC: Alert and oriented to person, place, and time. Normal reflexes, muscle tone coordination.  PSYCHIATRIC: Normal mood and affect. Normal behavior. Normal judgment and thought content. CARDIOVASCULAR: Normal heart rate noted, regular rhythm RESPIRATORY: Clear to auscultation bilaterally. Effort and breath sounds normal, no problems with respiration noted. BREASTS: Symmetric in size. No masses, tenderness, skin changes, nipple drainage, or lymphadenopathy bilaterally. Performed in the presence of a chaperone. ABDOMEN: Soft, no distention noted.  No tenderness, rebound or guarding.  PELVIC:  Normal appearing external genitalia and urethral meatus; normal appearing vaginal mucosa and cervix.  No abnormal discharge noted.  Pap smear obtained.  Normal uterine size, no other palpable masses, no uterine or adnexal tenderness.  Performed in the presence of a chaperone.   Assessment and Plan:    1. Screening examination for STD (sexually transmitted disease) Per pt request  - Cervicovaginal ancillary only( [place]) - HIV Antibody (routine testing w rflx) - RPR - Hepatitis B Surface  AntiGEN - Hepatitis C Antibody  2. Women's annual routine gynecological examination Pt continue to have regular menses without hot flashes  CIN 1 on last biopsy will review pap for resolution - Cytology - PAP( [place])  3. IUD (intrauterine device) in place IUD strings were easily seen  Will follow up results of pap smear and manage accordingly. Routine preventative health maintenance measures emphasized. Please refer to After Visit Summary for other counseling recommendations.      Lynnda Shields, MD, [place] [facility], [place]

## 2022-11-30 NOTE — Progress Notes (Signed)
Pt reports in the last 3 days that she has been having some pain in her back especially when she breathes in.

## 2022-12-01 LAB — CERVICOVAGINAL ANCILLARY ONLY
Bacterial Vaginitis (gardnerella): NEGATIVE
Candida Glabrata: NEGATIVE
Candida Vaginitis: NEGATIVE
Chlamydia: NEGATIVE
Comment: NEGATIVE
Comment: NEGATIVE
Comment: NEGATIVE
Comment: NEGATIVE
Comment: NEGATIVE
Comment: NORMAL
Neisseria Gonorrhea: NEGATIVE
Trichomonas: NEGATIVE

## 2022-12-01 LAB — HIV ANTIBODY (ROUTINE TESTING W REFLEX): HIV Screen 4th Generation wRfx: NONREACTIVE

## 2022-12-01 LAB — RPR: RPR Ser Ql: NONREACTIVE

## 2022-12-01 LAB — HEPATITIS B SURFACE ANTIGEN: Hepatitis B Surface Ag: NEGATIVE

## 2022-12-01 LAB — HEPATITIS C ANTIBODY: Hep C Virus Ab: NONREACTIVE

## 2022-12-03 LAB — CYTOLOGY - PAP
Comment: NEGATIVE
Comment: NEGATIVE
Comment: NEGATIVE
HPV 16: NEGATIVE
HPV 18 / 45: NEGATIVE
High risk HPV: POSITIVE — AB

## 2022-12-13 ENCOUNTER — Telehealth: Payer: Self-pay | Admitting: *Deleted

## 2022-12-13 NOTE — Telephone Encounter (Signed)
-----   Message from Griffin Basil, MD sent at 12/13/2022  8:25 AM EDT ----- CIN1 on pap, pt previously had CIN!1 on previous exam, schedule for colpo

## 2022-12-13 NOTE — Telephone Encounter (Signed)
Called patient with Interpreter Julieanne Manson that we are calling with results and appointment- please call office back. Staci Acosta

## 2022-12-14 NOTE — Telephone Encounter (Signed)
Attempted to contact with [facility] and left message that we are calling with results and we send a letter.  Attempted home number and phone kept ringing.   Letter sent.  Frances Nickels  12/14/22

## 2023-01-11 IMAGING — MG MM DIGITAL DIAGNOSTIC UNILAT*L* W/ TOMO W/ CAD
4 series · 4 of 12 positions shown · non-contrast
Comparison: Previous exam(s).

CLINICAL DATA: Six-month follow-up of a probably benign left breast
mass seen on ultrasound.

EXAM:
DIGITAL DIAGNOSTIC UNILATERAL LEFT MAMMOGRAM WITH TOMOSYNTHESIS AND
CAD; ULTRASOUND LEFT BREAST LIMITED
TECHNIQUE: Left digital diagnostic mammography and breast tomosynthesis was
performed. The images were evaluated with computer-aided detection.;
Targeted ultrasound examination of the left breast was performed.

[L MLO synth-2D]
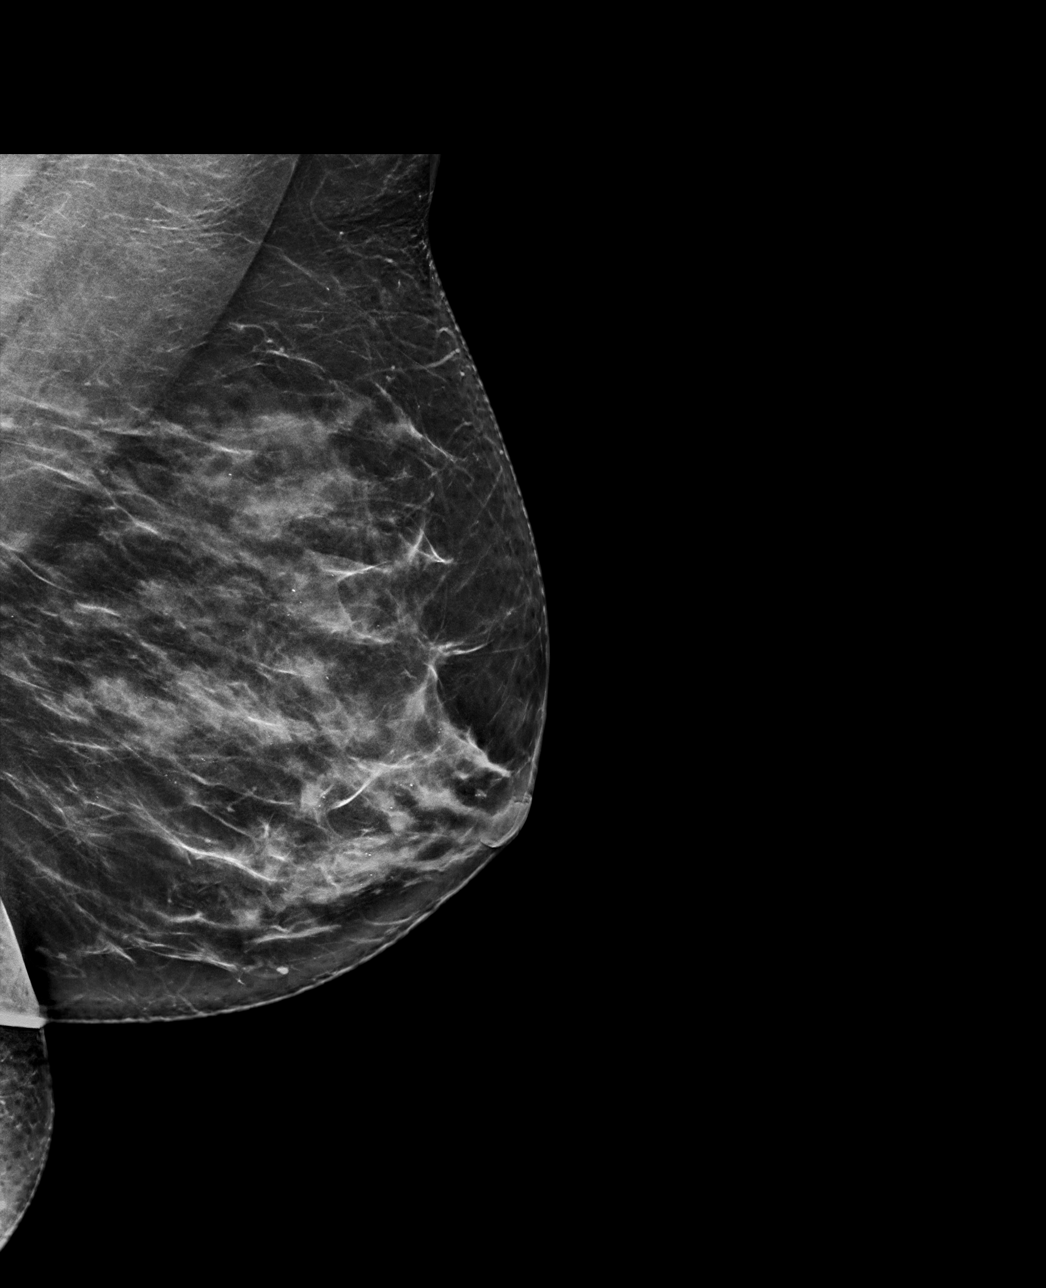

[L CC synth-2D]
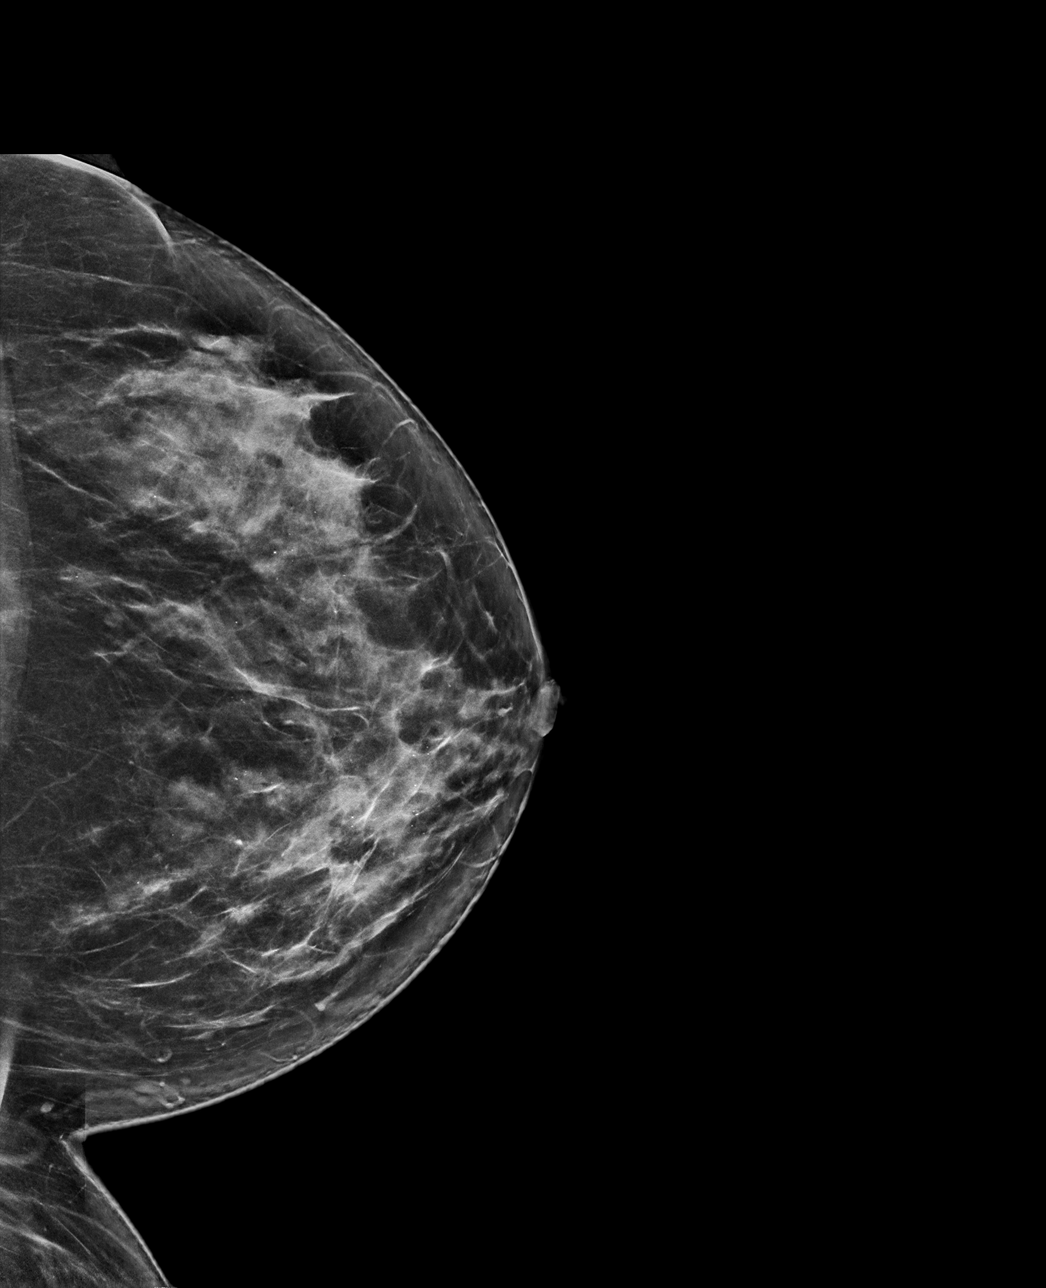

[L CC tomo · tomo slice 37/73.0]
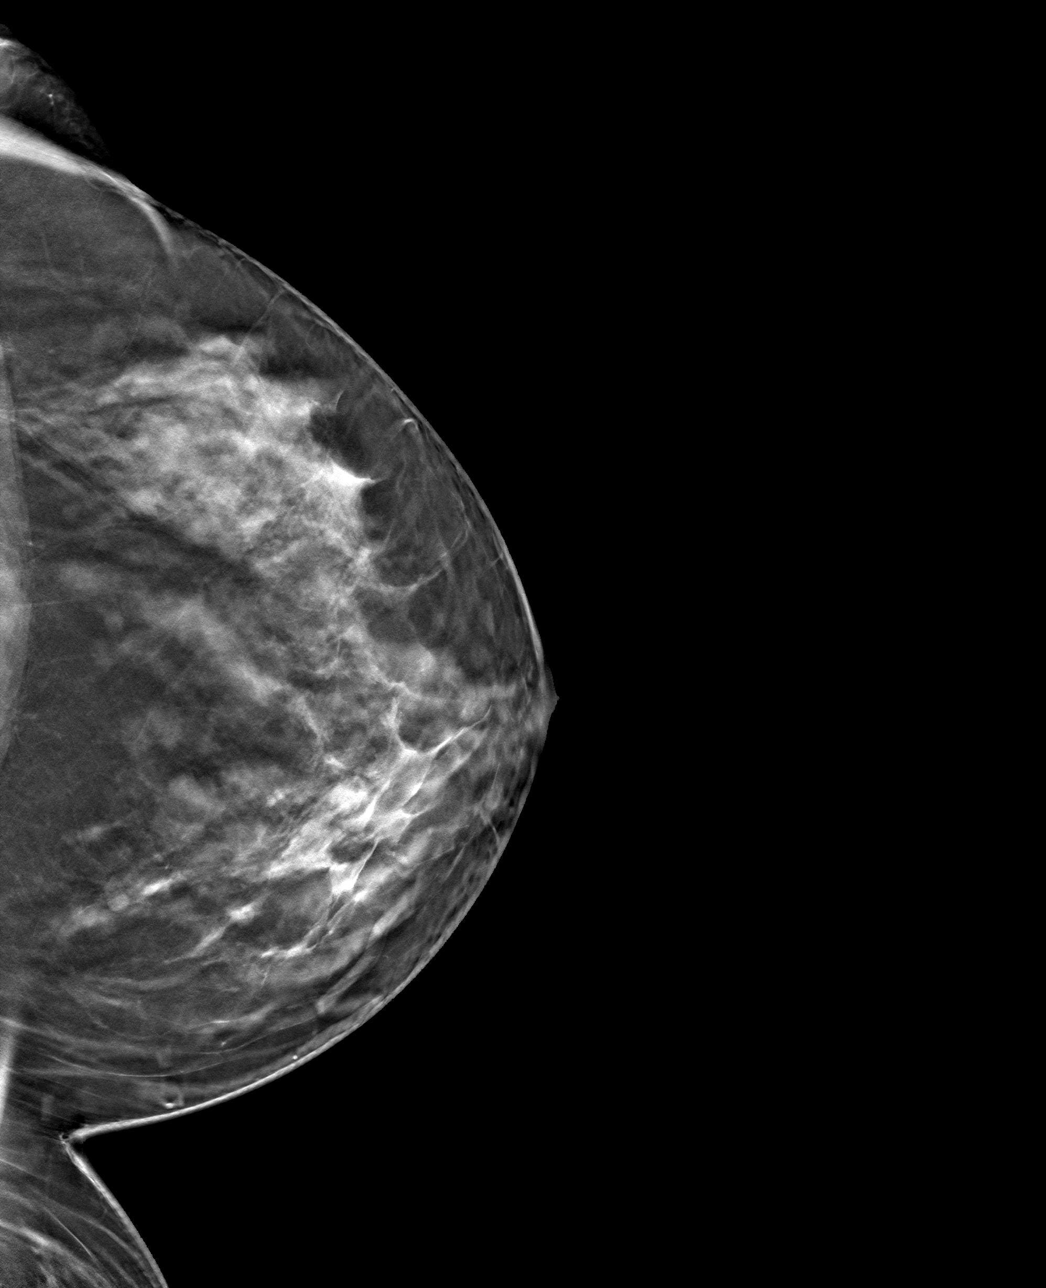

[L MLO tomo · tomo slice 38/75.0]
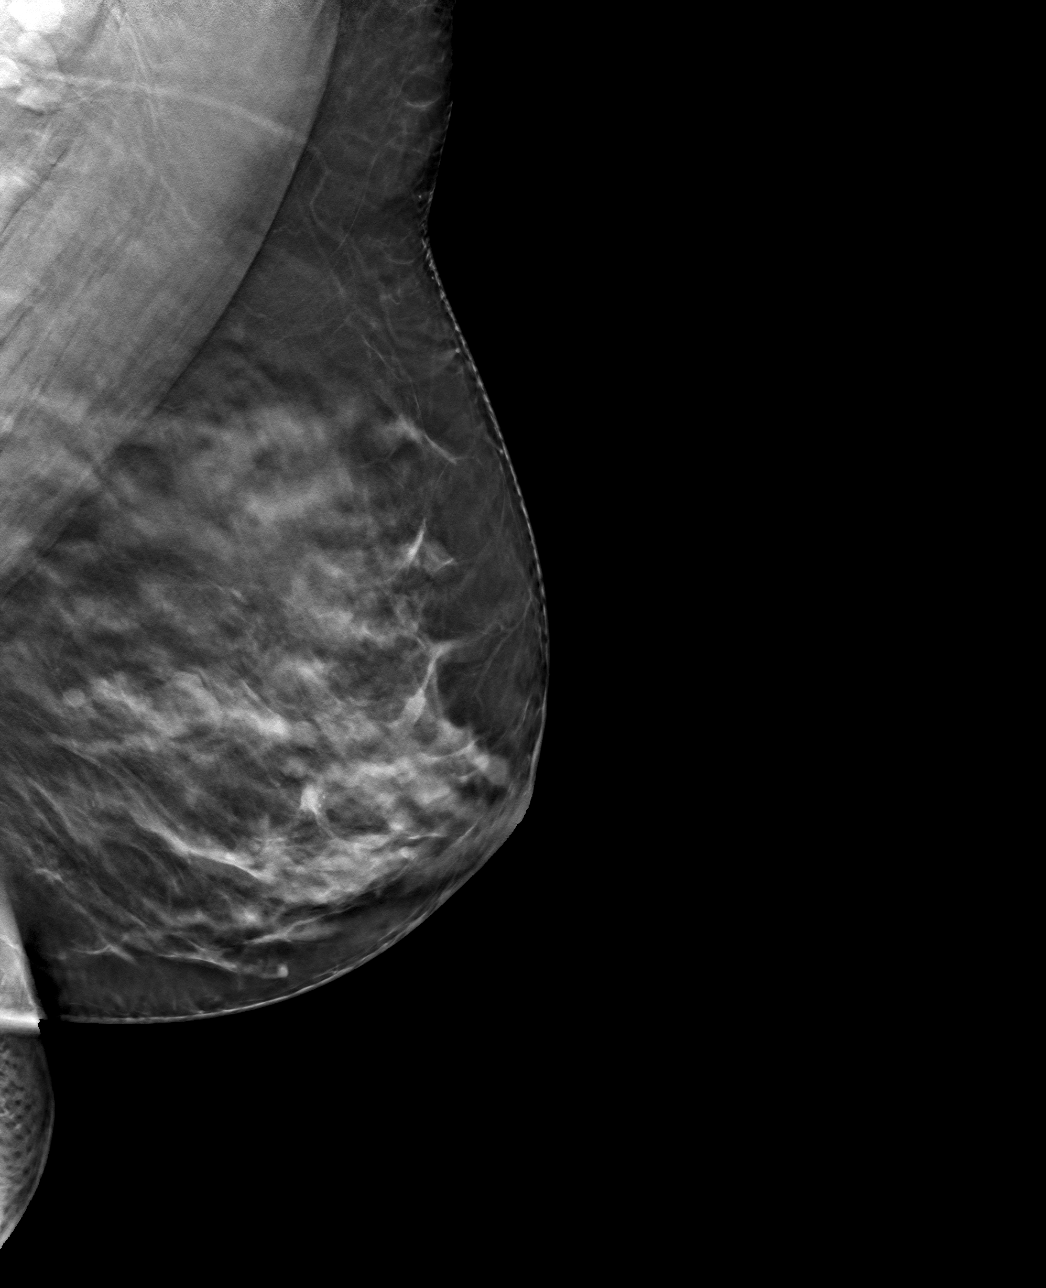

[4 of 12 positions shown; findings below may reference images not displayed]

ACR Breast Density Category c: The breast tissue is heterogeneously
dense, which may obscure small masses.
FINDINGS: Most of the masses noted to be cysts identified in Monday January, 2021 have
resolved mammographically. The patient does have a waxing and waning
cystic pattern. No suspicious mammographic findings.

On physical exam, no suspicious lumps are identified.

Targeted ultrasound is performed, showing interval resolution of the
probably benign mass at 4 o'clock, 2 cm from the nipple. There is a
cluster of cysts in this region not requiring follow-up.
IMPRESSION: Fibrocystic changes. No evidence of malignancy. Resolution of the
previously identified probably benign mass.

RECOMMENDATION:
Annual screening mammography in Tuesday January, 2022.

I have discussed the findings and recommendations with the patient.
If applicable, a reminder letter will be sent to the patient
regarding the next appointment.

BI-RADS CATEGORY  2: Benign.

## 2023-06-06 ENCOUNTER — Telehealth: Payer: Self-pay | Admitting: Family Medicine

## 2023-06-06 NOTE — Telephone Encounter (Signed)
Pt. Wants to know her results from her last visit in March 2024, As per patient she did not get her results. Informed her we tried reaching her but no answer. Patient informed me that when she is at work sometimes she do not have service she will like for her results be left in a voicemail message if she do not answer.

## 2023-06-13 NOTE — Telephone Encounter (Signed)
Called patient with Sheena Walton assisting with spanish interpretation, no answer- left message stating we are trying to reach you to return your phone call, please call us back.   Patient had abnormal pap smear & needs colpo

## 2023-06-14 NOTE — Telephone Encounter (Signed)
Called patient with Sheena Walton assisting with spanish interpretation and informed her of results and discussed colposcopy procedure. Told patient someone from our front office will call her with an appt for the colposcopy. Patient verbalized understanding.

## 2023-07-25 ENCOUNTER — Other Ambulatory Visit (HOSPITAL_COMMUNITY)
Admission: RE | Admit: 2023-07-25 | Discharge: 2023-07-25 | Disposition: A | Discharge status: Home / Self Care | Payer: Managed Care, Other (non HMO) | Source: Ambulatory Visit | Attending: Obstetrics and Gynecology | Admitting: Obstetrics and Gynecology

## 2023-07-25 ENCOUNTER — Encounter: Payer: Self-pay | Admitting: Obstetrics and Gynecology

## 2023-07-25 ENCOUNTER — Ambulatory Visit (INDEPENDENT_AMBULATORY_CARE_PROVIDER_SITE_OTHER): Payer: Managed Care, Other (non HMO) | Admitting: Obstetrics and Gynecology

## 2023-07-25 ENCOUNTER — Other Ambulatory Visit: Payer: Self-pay | Admitting: Obstetrics and Gynecology

## 2023-07-25 ENCOUNTER — Other Ambulatory Visit: Payer: Self-pay

## 2023-07-25 VITALS — BP 163/90 | HR 68 | Ht 63.0 in | Wt 177.7 lb

## 2023-07-25 DIAGNOSIS — N870 Mild cervical dysplasia: Secondary | ICD-10-CM | POA: Diagnosis not present

## 2023-07-25 DIAGNOSIS — Z0000 Encounter for general adult medical examination without abnormal findings: Secondary | ICD-10-CM

## 2023-07-25 DIAGNOSIS — I10 Essential (primary) hypertension: Secondary | ICD-10-CM

## 2023-07-25 NOTE — Progress Notes (Signed)
Colposcopy note:    Patient given informed consent, signed copy in the chart, time out was performed.  Placed in lithotomy position.  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  Pt notes menses q 5-6 months.  UPT negative. Cervix viewed with speculum and colposcope after application of acetic acid.   Colposcopy adequate?  Yes and IUD string noted Acetowhite lesions?yes at 12 and 2 oclock Punctation?  none Mosaicism?   None  Abnormal vasculature?  equivocal Biopsies? Yes at 12 and 2 oclock ECC?  none  COMMENTS: Patient was given post procedure instructions.  Further treatment dependent of biopsy results.  IUD is 56 years old, would need replacement in 1-2 years  Warden Fillers, MD

## 2023-07-25 NOTE — Addendum Note (Signed)
Addended by: Henrietta Dine on: 07/25/2023 04:57 PM   Modules accepted: Orders

## 2023-07-27 LAB — SURGICAL PATHOLOGY

## 2023-07-28 ENCOUNTER — Telehealth: Payer: Self-pay | Admitting: Lactation Services

## 2023-07-28 NOTE — Telephone Encounter (Signed)
-----   Message from Warden Fillers sent at 07/27/2023  4:30 PM EDT ----- CIN 1 on biopsy repap in 1 year, call patient with results

## 2023-07-28 NOTE — Telephone Encounter (Signed)
Called patient with assistance of Eda Royal. In house spanish interpreter. Reviewed results and need for repeat pap smear in 1 year. Reviewed calling office in 9 months to reschedule. Patient voiced understanding.

## 2023-08-23 ENCOUNTER — Ambulatory Visit
Admission: RE | Admit: 2023-08-23 | Discharge: 2023-08-23 | Disposition: A | Discharge status: Home / Self Care | Payer: Managed Care, Other (non HMO) | Source: Ambulatory Visit | Attending: Obstetrics and Gynecology | Admitting: Obstetrics and Gynecology

## 2023-08-23 DIAGNOSIS — Z0000 Encounter for general adult medical examination without abnormal findings: Secondary | ICD-10-CM

## 2024-07-02 ENCOUNTER — Other Ambulatory Visit: Payer: Self-pay | Admitting: Obstetrics and Gynecology

## 2024-07-02 DIAGNOSIS — Z1231 Encounter for screening mammogram for malignant neoplasm of breast: Secondary | ICD-10-CM

## 2024-08-16 ENCOUNTER — Encounter: Payer: Self-pay | Admitting: Obstetrics and Gynecology

## 2024-08-16 ENCOUNTER — Ambulatory Visit (INDEPENDENT_AMBULATORY_CARE_PROVIDER_SITE_OTHER): Admitting: Obstetrics and Gynecology

## 2024-08-16 ENCOUNTER — Other Ambulatory Visit: Payer: Self-pay

## 2024-08-16 ENCOUNTER — Other Ambulatory Visit (HOSPITAL_COMMUNITY)
Admission: RE | Admit: 2024-08-16 | Discharge: 2024-08-16 | Disposition: A | Source: Ambulatory Visit | Attending: Obstetrics and Gynecology | Admitting: Obstetrics and Gynecology

## 2024-08-16 VITALS — BP 155/77 | HR 60 | Wt 182.8 lb

## 2024-08-16 DIAGNOSIS — Z124 Encounter for screening for malignant neoplasm of cervix: Secondary | ICD-10-CM | POA: Diagnosis present

## 2024-08-16 DIAGNOSIS — Z01419 Encounter for gynecological examination (general) (routine) without abnormal findings: Secondary | ICD-10-CM

## 2024-08-16 DIAGNOSIS — Z113 Encounter for screening for infections with a predominantly sexual mode of transmission: Secondary | ICD-10-CM

## 2024-08-16 DIAGNOSIS — Z758 Other problems related to medical facilities and other health care: Secondary | ICD-10-CM

## 2024-08-16 DIAGNOSIS — R200 Anesthesia of skin: Secondary | ICD-10-CM

## 2024-08-16 NOTE — Progress Notes (Signed)
 ANNUAL EXAM Patient name: Sheena Walton MRN [id number]  Date of birth: 1966-04-02 Chief Complaint:   Gynecologic Exam (/)  History of Present Illness:   Sheena Walton is a 58 y.o. G2P0 being seen today for a routine annual exam.  Current complaints: repeat pap  Menstrual concerns? No  last menses 08/2023 or January 2025, spotting Breast or nipple changes? No  Contraception use? Yes paragard IUD Sexually active? Yes female partner/husband, no pain with intercourse  No LMP recorded.   The pregnancy intention screening data noted above was reviewed. Potential methods of contraception were discussed. The patient elected to proceed with No data recorded.   Last pap     Component Value Date/Time   DIAGPAP - Low grade squamous intraepithelial lesion (LSIL) (A) 11/30/2022 1356   HPVHIGH Positive (A) 11/30/2022 1356   ADEQPAP  11/30/2022 1356    Satisfactory for evaluation; transformation zone component PRESENT.   Colpo: CIN 1 AT 12 & 2 o'clock   Last mammogram: 07/2023 BIRADS 1.  Last colonoscopy: not seen on file.      07/25/2023    3:35 PM 11/30/2022    1:52 PM 03/03/2021   11:49 AM  Depression screen PHQ 2/9  Decreased Interest 0 0 0  Down, Depressed, Hopeless 0 0 0  PHQ - 2 Score 0 0 0  Altered sleeping 0 0 0  Tired, decreased energy 0 0 0  Change in appetite 0 0 0  Feeling bad or failure about yourself  0 0 0  Trouble concentrating 0 0 0  Moving slowly or fidgety/restless 0 0 0  Suicidal thoughts 0 0 0  PHQ-9 Score 0  0  0      Data saved with a previous flowsheet row definition        07/25/2023    3:36 PM 11/30/2022    1:52 PM 03/03/2021   11:50 AM  GAD 7 : Generalized Anxiety Score  Nervous, Anxious, on Edge 0 0 0  Control/stop worrying 0 0 0  Worry too much - different things 0 0 0  Trouble relaxing 0 0 0  Restless 0 0 0  Easily annoyed or irritable 0 0 0  Afraid - awful might happen 0 0 0  Total GAD 7 Score 0 0 0     Review of Systems:    Pertinent items are noted in HPI Denies any headaches, blurred vision, fatigue, shortness of breath, chest pain, abdominal pain, abnormal vaginal discharge/itching/odor/irritation, problems with periods, bowel movements, urination, or intercourse unless otherwise stated above. Pertinent History Reviewed:  Reviewed past medical,surgical, social and family history.  Reviewed problem list, medications and allergies. Physical Assessment:   Vitals:   08/16/24 1002  BP: (!) 155/77  Pulse: 60  Weight: 182 lb 12.8 oz (82.9 kg)  Body mass index is 32.38 kg/m.        Physical Examination:   General appearance - well appearing, and in no distress  Mental status - alert, oriented to person, place, and time  Psych:  She has a normal mood and affect  Skin - warm and dry, normal color, no suspicious lesions noted  Chest - effort normal, all lung fields clear to auscultation bilaterally  Heart - normal rate and regular rhythm  Abdomen - soft, nontender, nondistended, no masses or organomegaly  Pelvic -  VULVA: normal appearing vulva with no masses, tenderness or lesions   VAGINA: normal appearing vagina with normal color and discharge, no lesions, atrophiy  CERVIX: normal appearing  cervix without discharge or lesions, no CMT  Thin prep pap is done with HR HPV cotesting  UTERUS: uterus is felt to be normal size, shape, consistency and nontender   ADNEXA: No adnexal masses or tenderness noted.  Extremities:  No swelling or varicosities noted  Chaperone present for exam  No results found for this or any previous visit (from the past 24 hours).    Assessment & Plan:  1. Well woman exam with routine gynecological exam (Primary) - Cervical cancer screening: Discussed guidelines. Pap with HPV collected - Breast Health: Encouraged self breast awareness/SBE. Discussed limits of clinical breast exam for detecting breast cancer. Discussed importance of annual MXR. scheduled - Climacteric/Sexual  health: Reviewed typical and atypical symptoms of menopause/peri-menopause. Discussed PMB and to call if any amount of spotting.  - Colonoscopy: Per PCP - F/U 12 months and prn   2. Screening for cervical cancer Post colposcopy pap completed today  - Cytology - PAP  3. Numbness and tingling in both hands Reported numbness/tingling sensation in bilateral fingertips, has not had this before. Does not have other PCP presently - initial labs ordered today  - HgB A1c - TSH Rfx on Abnormal to Free T4 - Vitamin B12 - Ambulatory Referral to Primary Care  4. Screening examination for STI - Cytology - PAP - RPR+HBsAg+HCVAb+...  5. Language barrier Spanish interpreter used for encounter  Orders Placed This Encounter  Procedures   Lipid panel   HgB A1c   RPR+HBsAg+HCVAb+...   TSH Rfx on Abnormal to Free T4   Vitamin B12   Ambulatory Referral to Primary Care    Meds: No orders of the defined types were placed in this encounter.   Follow-up: No follow-ups on file.  Carter Quarry, MD 08/16/2024 10:21 AM

## 2024-08-17 LAB — LIPID PANEL
Chol/HDL Ratio: 4.4 ratio (ref 0.0–4.4)
Cholesterol, Total: 198 mg/dL (ref 100–199)
HDL: 45 mg/dL (ref >39)
LDL Chol Calc (NIH): 133 mg/dL — ABNORMAL HIGH (ref 0–99)
Triglycerides: 113 mg/dL (ref 0–149)
VLDL Cholesterol Cal: 20 mg/dL (ref 5–40)

## 2024-08-17 LAB — VITAMIN B12: Vitamin B-12: >2000 pg/mL — ABNORMAL HIGH (ref 232–1245)

## 2024-08-17 LAB — RPR+HBSAG+HCVAB+...
HIV Screen 4th Generation wRfx: NONREACTIVE
Hep C Virus Ab: NONREACTIVE
Hepatitis B Surface Ag: NEGATIVE
RPR Ser Ql: NONREACTIVE

## 2024-08-17 LAB — HEMOGLOBIN A1C
Est. average glucose Bld gHb Est-mCnc: 128 mg/dL
Hgb A1c MFr Bld: 6.1 % — ABNORMAL HIGH (ref 4.8–5.6)

## 2024-08-17 LAB — TSH RFX ON ABNORMAL TO FREE T4: TSH: 1.49 u[IU]/mL (ref 0.450–4.500)

## 2024-08-20 ENCOUNTER — Ambulatory Visit: Payer: Self-pay | Admitting: Obstetrics and Gynecology

## 2024-08-20 DIAGNOSIS — R7989 Other specified abnormal findings of blood chemistry: Secondary | ICD-10-CM

## 2024-08-20 DIAGNOSIS — R200 Anesthesia of skin: Secondary | ICD-10-CM

## 2024-08-21 ENCOUNTER — Other Ambulatory Visit: Payer: Self-pay

## 2024-08-21 DIAGNOSIS — R7989 Other specified abnormal findings of blood chemistry: Secondary | ICD-10-CM

## 2024-08-21 LAB — CYTOLOGY - PAP
Comment: NEGATIVE
Diagnosis: NEGATIVE
High risk HPV: NEGATIVE

## 2024-08-21 NOTE — Telephone Encounter (Signed)
-----   Message from Rosaline Pendleton, RN sent at 08/21/2024 11:13 AM EST -----   ----- Message ----- From: Jeralyn Crutch, MD Sent: 08/20/2024   6:09 PM EST To: [facility] Clinical Pool  Notify patient that thyroid function and cholesterol normal. A1c is in the pre-diabetic range, recommend lifestyle changes (diet exercise). B12 level is high, if taking a supplement, would recommend  stopping. If not taking a B12 supplement, recommend CMP as well to check liver and renal function. Typically able to get rid of extra B12.  ----- Message ----- From: Interface, [facility] Lab Results In Sent: 08/17/2024   4:36 AM EST To: Crutch Jeralyn, MD

## 2024-08-21 NOTE — Telephone Encounter (Addendum)
 Called patient with in house Spanish interpreter Therisa regarding recent lab results. Notified patient of normal thyroid and cholesterol. Advised lifestyle changes to help decrease A1c. Notified of elevated B12, patient denies taking B12 supplements and notified patient physician's advise for lab visit for check CMP, liver and renal function. Patient voiced understanding.   Patient scheduled for lab visit on 08/31/24 at 1120 for CMP.  Rosaline, RN

## 2024-08-21 NOTE — Telephone Encounter (Addendum)
 Called patient with in house Spanish interpreter. Unable to reach patient, left VM to call our office back.   Rosaline, RN    ----- Message from Carter Quarry sent at 08/20/2024  6:09 PM EST ----- Notify patient that thyroid function and cholesterol normal. A1c is in the pre-diabetic range, recommend lifestyle changes (diet exercise). B12 level is high, if taking a supplement, would recommend  stopping. If not taking a B12 supplement, recommend CMP as well to check liver and renal function. Typically able to get rid of extra B12.  ----- Message ----- From: Interface, [facility] Lab Results In Sent: 08/17/2024   4:36 AM EST To: Carter Quarry, MD

## 2024-08-27 ENCOUNTER — Ambulatory Visit
Admission: RE | Admit: 2024-08-27 | Discharge: 2024-08-27 | Disposition: A | Source: Ambulatory Visit | Attending: Obstetrics and Gynecology | Admitting: Obstetrics and Gynecology

## 2024-08-27 ENCOUNTER — Other Ambulatory Visit: Payer: Self-pay | Admitting: Obstetrics and Gynecology

## 2024-08-27 DIAGNOSIS — Z1231 Encounter for screening mammogram for malignant neoplasm of breast: Secondary | ICD-10-CM

## 2024-08-31 ENCOUNTER — Other Ambulatory Visit

## 2024-09-07 ENCOUNTER — Other Ambulatory Visit

## 2024-09-07 ENCOUNTER — Other Ambulatory Visit: Payer: Self-pay

## 2024-09-07 DIAGNOSIS — R7989 Other specified abnormal findings of blood chemistry: Secondary | ICD-10-CM

## 2024-09-07 DIAGNOSIS — R200 Anesthesia of skin: Secondary | ICD-10-CM

## 2024-09-08 LAB — COMPREHENSIVE METABOLIC PANEL WITH GFR
ALT: 20 IU/L (ref 0–32)
AST: 22 IU/L (ref 0–40)
Albumin: 4.3 g/dL (ref 3.8–4.9)
Alkaline Phosphatase: 117 IU/L (ref 49–135)
BUN/Creatinine Ratio: 17 (ref 9–23)
BUN: 13 mg/dL (ref 6–24)
Bilirubin Total: 0.2 mg/dL (ref 0.0–1.2)
CO2: 24 mmol/L (ref 20–29)
Calcium: 9.9 mg/dL (ref 8.7–10.2)
Chloride: 104 mmol/L (ref 96–106)
Creatinine, Ser: 0.77 mg/dL (ref 0.57–1.00)
Globulin, Total: 2.5 g/dL (ref 1.5–4.5)
Glucose: 87 mg/dL (ref 70–99)
Potassium: 4.7 mmol/L (ref 3.5–5.2)
Sodium: 141 mmol/L (ref 134–144)
Total Protein: 6.8 g/dL (ref 6.0–8.5)
eGFR: 89 mL/min/1.73 (ref >59)

## 2024-09-10 ENCOUNTER — Ambulatory Visit: Payer: Self-pay | Admitting: Obstetrics and Gynecology

## 2024-09-10 NOTE — Telephone Encounter (Addendum)
 Attempted to contact pt about normal results, Spanish Interpreter, [place], left voicemail with office call back number.   [place], RN  ----- Message from Carter Quarry, MD sent at 09/10/2024 11:05 AM EST ----- Notify that labs normal and to follow up with primary care about tingling fingers
